# Patient Record
Sex: Female | Born: 1960 | Race: White | Hispanic: No | Marital: Married | Smoking: Current every day smoker
Health system: Southern US, Community
[De-identification: ages and names within clinical notes are randomized; demographics above are authoritative.]

## PROBLEM LIST (undated history)

## (undated) DIAGNOSIS — J45909 Unspecified asthma, uncomplicated: Secondary | ICD-10-CM

## (undated) DIAGNOSIS — R42 Dizziness and giddiness: Secondary | ICD-10-CM

---

## 1999-06-03 ENCOUNTER — Emergency Department (HOSPITAL_COMMUNITY): Admission: EM | Admit: 1999-06-03 | Discharge: 1999-06-03 | Payer: Self-pay | Admitting: Emergency Medicine

## 2010-04-16 ENCOUNTER — Emergency Department (HOSPITAL_BASED_OUTPATIENT_CLINIC_OR_DEPARTMENT_OTHER): Admission: EM | Admit: 2010-04-16 | Discharge: 2010-04-16 | Payer: Self-pay | Admitting: Emergency Medicine

## 2010-04-26 ENCOUNTER — Emergency Department (HOSPITAL_BASED_OUTPATIENT_CLINIC_OR_DEPARTMENT_OTHER): Admission: EM | Admit: 2010-04-26 | Discharge: 2010-04-26 | Payer: Self-pay | Admitting: Emergency Medicine

## 2010-05-06 ENCOUNTER — Emergency Department (HOSPITAL_BASED_OUTPATIENT_CLINIC_OR_DEPARTMENT_OTHER): Admission: EM | Admit: 2010-05-06 | Discharge: 2010-05-06 | Payer: Self-pay | Admitting: Emergency Medicine

## 2010-05-30 ENCOUNTER — Emergency Department (HOSPITAL_COMMUNITY): Admission: EM | Admit: 2010-05-30 | Discharge: 2010-05-30 | Payer: Self-pay | Admitting: Family Medicine

## 2011-03-04 LAB — GLUCOSE, CAPILLARY: Glucose-Capillary: 92 mg/dL (ref 70–99)

## 2017-02-03 ENCOUNTER — Emergency Department (HOSPITAL_COMMUNITY): Payer: Self-pay

## 2017-02-03 ENCOUNTER — Emergency Department (HOSPITAL_COMMUNITY)
Admission: EM | Admit: 2017-02-03 | Discharge: 2017-02-03 | Disposition: A | Discharge status: Home / Self Care | Payer: Self-pay | Attending: Emergency Medicine | Admitting: Emergency Medicine

## 2017-02-03 ENCOUNTER — Encounter (HOSPITAL_COMMUNITY): Payer: Self-pay | Admitting: Emergency Medicine

## 2017-02-03 DIAGNOSIS — J45909 Unspecified asthma, uncomplicated: Secondary | ICD-10-CM | POA: Insufficient documentation

## 2017-02-03 DIAGNOSIS — F17210 Nicotine dependence, cigarettes, uncomplicated: Secondary | ICD-10-CM | POA: Insufficient documentation

## 2017-02-03 DIAGNOSIS — J209 Acute bronchitis, unspecified: Secondary | ICD-10-CM | POA: Insufficient documentation

## 2017-02-03 HISTORY — DX: Dizziness and giddiness: R42

## 2017-02-03 HISTORY — DX: Unspecified asthma, uncomplicated: J45.909

## 2017-02-03 MED ORDER — PREDNISONE 20 MG PO TABS
40.0000 mg | ORAL_TABLET | Freq: Once | ORAL | Status: AC
  Administered 2017-02-03: 40 mg via ORAL
  Filled 2017-02-03: qty 2

## 2017-02-03 MED ORDER — IBUPROFEN 800 MG PO TABS
800.0000 mg | ORAL_TABLET | Freq: Once | ORAL | Status: AC
  Administered 2017-02-03: 800 mg via ORAL
  Filled 2017-02-03: qty 1

## 2017-02-03 MED ORDER — HYDROCOD POLST-CPM POLST ER 10-8 MG/5ML PO SUER
5.0000 mL | Freq: Once | ORAL | Status: AC
  Administered 2017-02-03: 5 mL via ORAL
  Filled 2017-02-03: qty 5

## 2017-02-03 MED ORDER — AZITHROMYCIN 250 MG PO TABS
500.0000 mg | ORAL_TABLET | Freq: Once | ORAL | Status: AC
  Administered 2017-02-03: 500 mg via ORAL
  Filled 2017-02-03: qty 2

## 2017-02-03 MED ORDER — AZITHROMYCIN 250 MG PO TABS: ORAL_TABLET | ORAL | 0 refills | Status: AC

## 2017-02-03 MED ORDER — DEXAMETHASONE 4 MG PO TABS: 4.0000 mg | ORAL_TABLET | Freq: Two times a day (BID) | ORAL | 0 refills | Status: AC

## 2017-02-03 MED ORDER — PROMETHAZINE-DM 6.25-15 MG/5ML PO SYRP: 5.0000 mL | ORAL_SOLUTION | Freq: Four times a day (QID) | ORAL | 0 refills | Status: AC | PRN

## 2017-02-03 MED ORDER — ALBUTEROL SULFATE HFA 108 (90 BASE) MCG/ACT IN AERS
2.0000 | INHALATION_SPRAY | Freq: Once | RESPIRATORY_TRACT | Status: AC
  Administered 2017-02-03: 2 via RESPIRATORY_TRACT
  Filled 2017-02-03: qty 6.7

## 2017-02-03 NOTE — ED Triage Notes (Signed)
Patient complains of cough x 1 month since she had the flu. Patient states fever at home. Afebrile in triage. NAD.

## 2017-02-03 NOTE — Discharge Instructions (Signed)
Your oxygen level is 100% on room air. Your chest x-ray is negative for acute lung problem or acute rib area problem. Your examination suggest bronchitis and upper respiratory infection. Please use Zithromax daily. Use 2 puffs of albuterol every 4 hours. Decadron 2 times daily with food, and use promethazine DM cough medication of 4 times daily as needed for cough. Please stop smoking.

## 2017-02-03 NOTE — ED Provider Notes (Signed)
[facility] Provider Note   CSN: [id number] Arrival date & time: 02/03/17  1811     History   Chief Complaint Chief Complaint  Patient presents with  . Cough    HPI Jasmin Zamora is a 56 y.o. female.  The history is provided by the patient.  Cough  This is a new problem. Episode onset: 1 month. The problem occurs every few hours. The problem has been gradually worsening. The cough is productive of sputum. There has been no fever. Associated symptoms include chest pain, chills, shortness of breath and wheezing. She has tried nothing for the symptoms. She is a smoker. Her past medical history is significant for bronchitis. Her past medical history does not include pneumonia, emphysema or asthma.    Past Medical History:  Diagnosis Date  . Asthma   . Vertigo     There are no active problems to display for this patient.   History reviewed. No pertinent surgical history.  OB History    No data available       Home Medications    Prior to Admission medications   Not on File    Family History No family history on file.  Social History Social History  Substance Use Topics  . Smoking status: Current Every Day Smoker    Packs/day: 0.50    Types: Cigarettes  . Smokeless tobacco: Never Used  . Alcohol use No     Allergies   Keflex [cephalexin]   Review of Systems Review of Systems  Constitutional: Positive for chills.  Respiratory: Positive for cough, shortness of breath and wheezing.   Cardiovascular: Positive for chest pain.  All other systems reviewed and are negative.    Physical Exam Updated Vital Signs BP 125/76 (BP Location: Right Arm)   Pulse 79   Temp 98 F (36.7 C) (Oral)   Resp 16   Ht 5\' 4"  (1.626 m)   Wt 54.9 kg   SpO2 100%   BMI 20.77 kg/m   Physical Exam  Constitutional: Vital signs are normal. She appears well-developed and well-nourished. She is active.  HENT:  Head: Normocephalic and atraumatic.  Right Ear:  Tympanic membrane, external ear and ear canal normal.  Left Ear: Tympanic membrane, external ear and ear canal normal.  Nose: Nose normal.  Mouth/Throat: Uvula is midline, oropharynx is clear and moist and mucous membranes are normal.  Nasal congestion present.  Eyes: Conjunctivae, EOM and lids are normal. Pupils are equal, round, and reactive to light.  Neck: Trachea normal, normal range of motion and phonation normal. Neck supple. Carotid bruit is not present.  Cardiovascular: Normal rate, regular rhythm and normal pulses.   Pulmonary/Chest:  End expiratory wheeze. Scattered rhonchi.  Abdominal: Soft. Normal appearance and bowel sounds are normal.  Lymphadenopathy:       Head (right side): No submental, no preauricular and no posterior auricular adenopathy present.       Head (left side): No submental, no preauricular and no posterior auricular adenopathy present.    She has no cervical adenopathy.  Neurological: She is alert. She has normal strength. No cranial nerve deficit or sensory deficit. GCS eye subscore is 4. GCS verbal subscore is 5. GCS motor subscore is 6.  Skin: Skin is warm and dry.  Psychiatric: Her speech is normal.     ED Treatments / Results  Labs (all labs ordered are listed, but only abnormal results are displayed) Labs Reviewed - No data to display  EKG  EKG Interpretation None  Radiology Dg Chest 2 View  Result Date: 02/03/2017 CLINICAL DATA:  56 y/o  F; 1 month of cough. EXAM: CHEST  2 VIEW COMPARISON:  None. FINDINGS: The heart size and mediastinal contours are within normal limits. Both lungs are clear. The visualized skeletal structures are unremarkable. IMPRESSION: No active cardiopulmonary disease. Electronically Signed   By: Mitzi HansenLance  Furusawa-Stratton M.D.   On: 02/03/2017 19:35    Procedures Procedures (including critical care time)  Medications Ordered in ED Medications - No data to display   Initial Impression / Assessment and Plan /  ED Course  I have reviewed the triage vital signs and the nursing notes.  Pertinent labs & imaging results that were available during my care of the patient were reviewed by me and considered in my medical decision making (see chart for details).     *I have reviewed nursing notes, vital signs, and all appropriate lab and imaging results for this patient.**  Final Clinical Impressions(s) / ED Diagnoses MDM Patient had influenza nearly a month ago. She states she's been having problems with cough since that time. 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 She is also a smoker. The chest x-ray is negative for acute problem. The examination suggest bronchitis. The plan at this time is for the patient to use albuterol 2 puffs every 4 hours, promethazine cough medication 4 times daily, Decadron 2 times daily, and Zithromax 1 daily. I've asked the patient to increase fluids. She will use Tylenol every 4 hours or ibuprofen every 6 hours for fever or aching. We discussed good handwashing, and we discussed good hydration. Patient is in agreement with this plan.    Final diagnoses:  Acute bronchitis, unspecified organism    New Prescriptions New Prescriptions   No medications on file     Ivery QualeHobson [name], Cordelia Poche-C 02/03/17 2037    Mancel BaleElliott Wentz, MD 02/04/17 1245

## 2017-02-03 NOTE — ED Notes (Signed)
Patient transported to X-ray 

## 2017-07-30 ENCOUNTER — Emergency Department (HOSPITAL_COMMUNITY)
Admission: EM | Admit: 2017-07-30 | Discharge: 2017-07-30 | Disposition: A | Discharge status: Home / Self Care | Payer: Self-pay | Attending: Emergency Medicine | Admitting: Emergency Medicine

## 2017-07-30 ENCOUNTER — Encounter (HOSPITAL_COMMUNITY): Payer: Self-pay | Admitting: Emergency Medicine

## 2017-07-30 ENCOUNTER — Emergency Department (HOSPITAL_COMMUNITY): Payer: Self-pay

## 2017-07-30 DIAGNOSIS — M79604 Pain in right leg: Secondary | ICD-10-CM | POA: Insufficient documentation

## 2017-07-30 DIAGNOSIS — J45909 Unspecified asthma, uncomplicated: Secondary | ICD-10-CM | POA: Insufficient documentation

## 2017-07-30 DIAGNOSIS — F17210 Nicotine dependence, cigarettes, uncomplicated: Secondary | ICD-10-CM | POA: Insufficient documentation

## 2017-07-30 DIAGNOSIS — G8929 Other chronic pain: Secondary | ICD-10-CM | POA: Insufficient documentation

## 2017-07-30 DIAGNOSIS — Z79899 Other long term (current) drug therapy: Secondary | ICD-10-CM | POA: Insufficient documentation

## 2017-07-30 MED ORDER — NAPROXEN 250 MG PO TABS
250.0000 mg | ORAL_TABLET | Freq: Two times a day (BID) | ORAL | 0 refills | Status: AC | PRN
Start: 2017-07-30 — End: ?

## 2017-07-30 NOTE — ED Triage Notes (Signed)
Pt has multiple complaints. Pt c/o vertigo and "lump" in leg x 2 years. Pt states pain in leg and increased lumps and swelling to right lower leg. Pt also reports flu like sx-ha/congestion.

## 2017-07-30 NOTE — Discharge Instructions (Signed)
Take the prescription as directed.  Apply moist heat or ice to the area(s) of discomfort, for 15 minutes at a time, several times per day for the next few days.  Do not fall asleep on a heating or ice pack.  Call your regular medical doctor tomorrow to schedule a follow up appointment within the next week.  Return to the Emergency Department immediately if worsening.

## 2017-07-30 NOTE — ED Provider Notes (Signed)
[facility] Provider Note   CSN: [id number] Arrival date & time: 07/30/17  1533     History   Chief Complaint Chief Complaint  Patient presents with  . Leg Pain    HPI [name] is a 56 y.o. female.  HPI  Pt was seen at 1825. Per pt, c/o gradual onset and persistence of constant RLE "pain" for the past 2 years, worse over the past several months. Pt states the pain has increased since she has had to "walk 5 miles to work." Pt states there have been "lumps" there for the past 2 years. Pt was evaluated by [facility] RN and was told she needed to come to the ED for evaluation. Denies rash, no fevers, no focal motor weakness, no tingling/numbness in extremities, no back pain, no abd pain.   Past Medical History:  Diagnosis Date  . Asthma   . Vertigo     There are no active problems to display for this patient.   History reviewed. No pertinent surgical history.  OB History    No data available       Home Medications    Prior to Admission medications   Medication Sig Start Date End Date Taking? Authorizing Provider  azithromycin (ZITHROMAX) 250 MG tablet 1 po daily. 02/03/17   Ivery QualeBryant, Hobson, PA-C  dexamethasone (DECADRON) 4 MG tablet Take 1 tablet (4 mg total) by mouth 2 (two) times daily with a meal. 02/03/17   Ivery QualeBryant, Hobson, PA-C  naproxen (NAPROSYN) 250 MG tablet Take 1 tablet (250 mg total) by mouth 2 (two) times daily as needed for mild pain or moderate pain (take with food). 07/30/17   Samuel JesterMcManus, Townsend Cudworth, DO  promethazine-dextromethorphan (PROMETHAZINE-DM) 6.25-15 MG/5ML syrup Take 5 mLs by mouth 4 (four) times daily as needed for cough. 02/03/17   Ivery QualeBryant, Hobson, PA-C    Family History No family history on file.  Social History Social History  Substance Use Topics  . Smoking status: Current Every Day Smoker    Packs/day: 0.50    Types: Cigarettes  . Smokeless tobacco: Never Used  . Alcohol use No     Allergies   Keflex  [cephalexin]   Review of Systems Review of Systems ROS: Statement: All systems negative except as marked or noted in the HPI; Constitutional: Negative for fever and chills. ; ; Eyes: Negative for eye pain, redness and discharge. ; ; ENMT: Negative for ear pain, hoarseness, nasal congestion, sinus pressure and sore throat. ; ; Cardiovascular: Negative for chest pain, palpitations, diaphoresis, dyspnea and peripheral edema. ; ; Respiratory: Negative for cough, wheezing and stridor. ; ; Gastrointestinal: Negative for nausea, vomiting, diarrhea, abdominal pain, blood in stool, hematemesis, jaundice and rectal bleeding. . ; ; Genitourinary: Negative for dysuria, flank pain and hematuria. ; ; Musculoskeletal: +RLE pain. Negative for back pain and neck pain. Negative for swelling and trauma.; ; Skin: Negative for pruritus, rash, abrasions, blisters, bruising and skin lesion.; ; Neuro: Negative for headache, lightheadedness and neck stiffness. Negative for weakness, altered level of consciousness, altered mental status, extremity weakness, paresthesias, involuntary movement, seizure and syncope.       Physical Exam Updated Vital Signs BP 125/81 (BP Location: Right Arm)   Pulse 94   Temp 98.1 F (36.7 C) (Oral)   Resp 18   Wt 55.8 kg (123 lb)   SpO2 98%   BMI 21.11 kg/m   Physical Exam 1830: Physical examination:  Nursing notes reviewed; Vital signs and O2 SAT reviewed;  Constitutional:  Well developed, Well nourished, Well hydrated, In no acute distress; Head:  Normocephalic, atraumatic; Eyes: EOMI, PERRL, No scleral icterus; ENMT: Mouth and pharynx normal, Mucous membranes moist; Neck: Supple, Full range of motion, No lymphadenopathy; Cardiovascular: Regular rate and rhythm, No gallop; Respiratory: Breath sounds clear & equal bilaterally, No wheezes.  Speaking full sentences with ease, Normal respiratory effort/excursion; Chest: Nontender, Movement normal; Abdomen: Soft, Nontender, Nondistended,  Normal bowel sounds; Genitourinary: No CVA tenderness; Extremities: Pulses normal, NT right hip/knee/ankle/foot. +right lateral lower leg and calf tenderness to palp, no deformity, no rash, no edema. +several small NT nodules palpated right lateral muscles, no fluctuance. No calf edema or asymmetry.; Neuro: AA&Ox3, Major CN grossly intact.  Speech clear. No gross focal motor or sensory deficits in extremities.; Skin: Color normal, Warm, Dry.   ED Treatments / Results  Labs (all labs ordered are listed, but only abnormal results are displayed)   EKG  EKG Interpretation None       Radiology   Procedures Procedures (including critical care time)  Medications Ordered in ED Medications - No data to display   Initial Impression / Assessment and Plan / ED Course  I have reviewed the triage vital signs and the nursing notes.  Pertinent labs & imaging results that were available during my care of the patient were reviewed by me and considered in my medical decision making (see chart for details).  MDM Reviewed: nursing note, vitals and previous chart Interpretation: ultrasound    US Venous Img Lower Unilateral Right Result Date: 07/30/2017 CLINICAL DATA:  Right lower extremity pain and swelling. EXAM: RIGHT LOWER EXTREMITY VENOUS DOPPLER ULTRASOUND TECHNIQUE: Gray-scale sonography with graded compression, as well as color Doppler and duplex ultrasound were performed to evaluate the lower extremity deep venous systems from the level of the common femoral vein and including the common femoral, femoral, profunda femoral, popliteal and calf veins including the posterior tibial, peroneal and gastrocnemius veins when visible. The superficial great saphenous vein was also interrogated. Spectral Doppler was utilized to evaluate flow at rest and with distal augmentation maneuvers in the common femoral, femoral and popliteal veins. COMPARISON:  None. FINDINGS: Contralateral Common Femoral Vein:  Respiratory phasicity is normal and symmetric with the symptomatic side. No evidence of thrombus. Normal compressibility. Common Femoral Vein: No evidence of thrombus. Normal compressibility, respiratory phasicity and response to augmentation. Saphenofemoral Junction: No evidence of thrombus. Normal compressibility and flow on color Doppler imaging. Profunda Femoral Vein: No evidence of thrombus. Normal compressibility and flow on color Doppler imaging. Femoral Vein: No evidence of thrombus. Normal compressibility, respiratory phasicity and response to augmentation. Popliteal Vein: No evidence of thrombus. Normal compressibility, respiratory phasicity and response to augmentation. Calf Veins: No evidence of thrombus. Normal compressibility and flow on color Doppler imaging of the posterior tibial vein, peroneal vein not well visualized. Superficial Great Saphenous Vein: No evidence of thrombus. Normal compressibility and flow on color Doppler imaging. Venous Reflux:  None. Other Findings:  None. IMPRESSION: No evidence of DVT within the right lower extremity. Electronically Signed   By: Rubye Oaks M.D.   On: 07/30/2017 16:58    1830:  [place] reassuring. Tx symptomatically, f/u PMD (outpt resources given). Dx and testing d/w pt and family.  Questions answered.  Verb understanding, agreeable to d/c home with outpt f/u.   Final Clinical Impressions(s) / ED Diagnoses     New Prescriptions    Samuel Jester, DO 08/02/17 1335

## 2017-10-26 IMAGING — US US EXTREM LOW VENOUS*R*
1 series · 13 of 24 positions shown · non-contrast
Comparison: None.

CLINICAL DATA: Right lower extremity pain and swelling.



[Series 1: us extrem low venous*right* · 0.06mm/px · 13 of 47 slices shown]
[im 1/47]
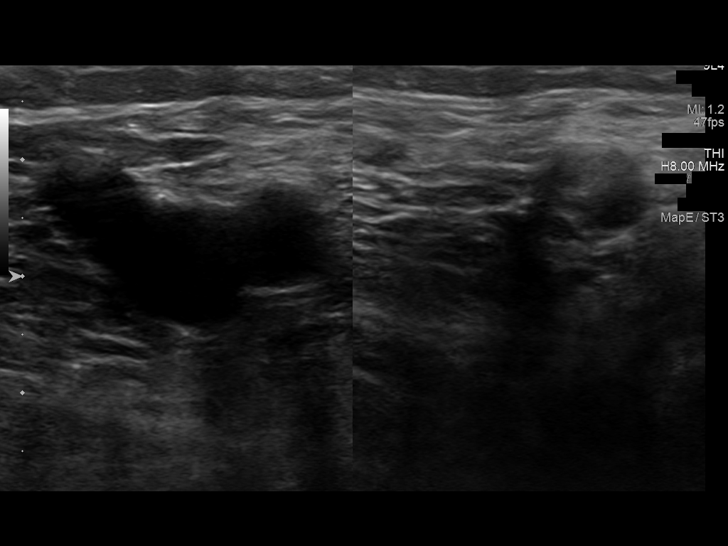
[im 5/47]
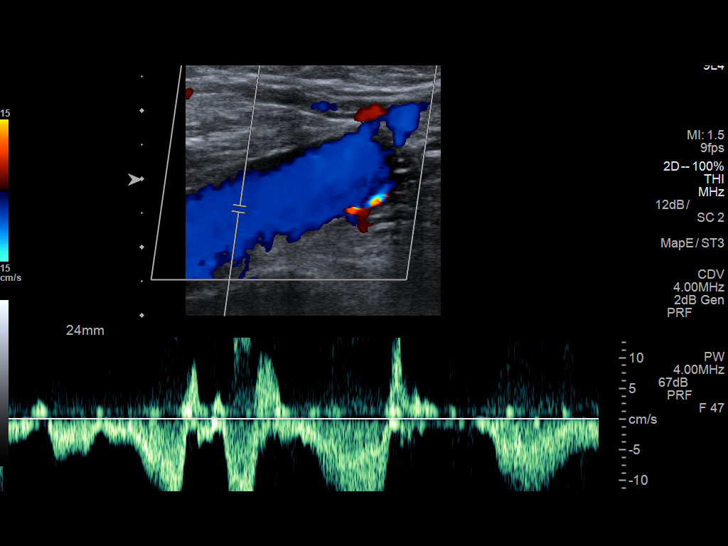
[im 9/47]
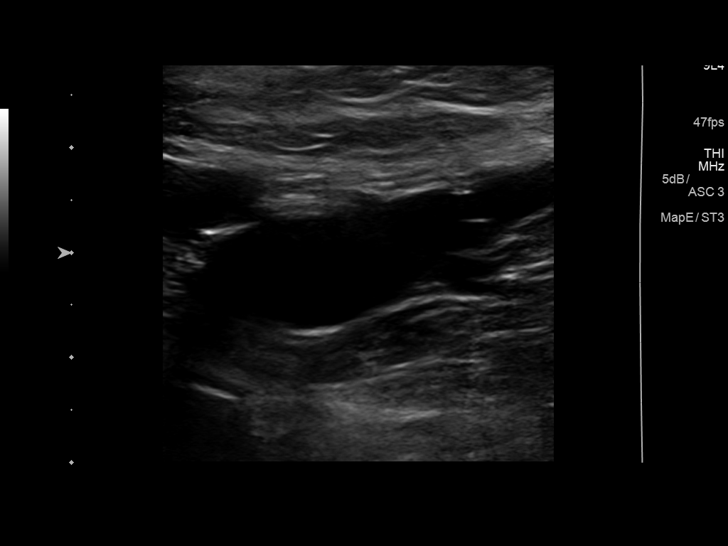
[im 13/47]
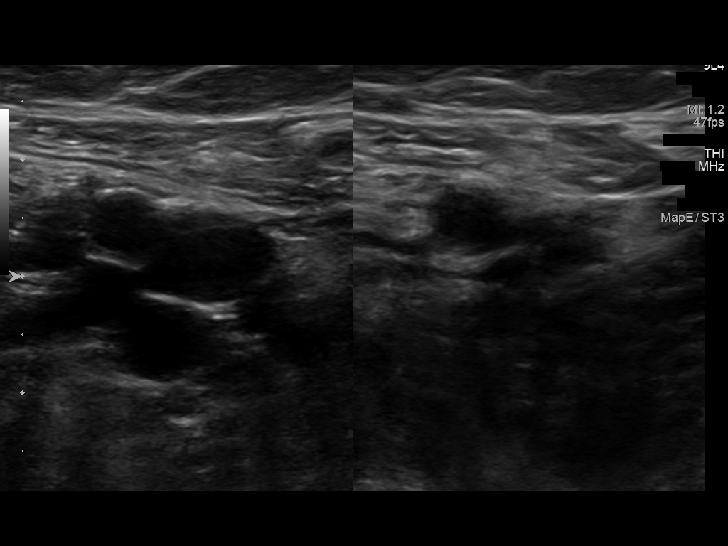
[im 17/47]
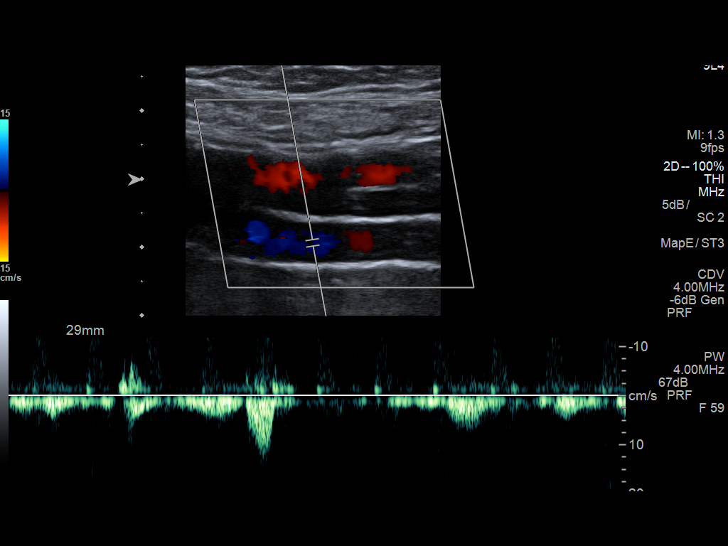
[im 21/47]
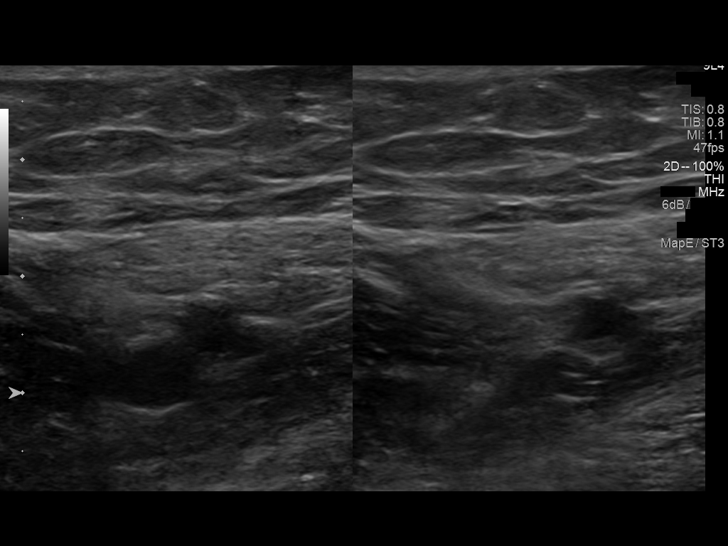
[im 25/47]
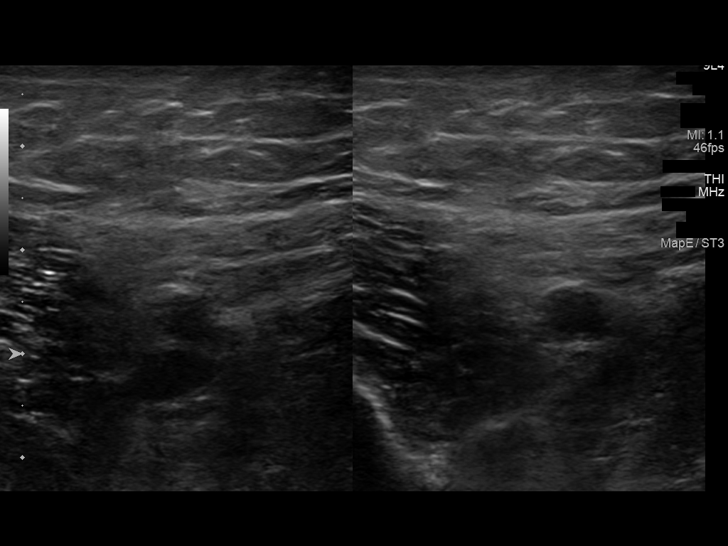
[im 27/47]
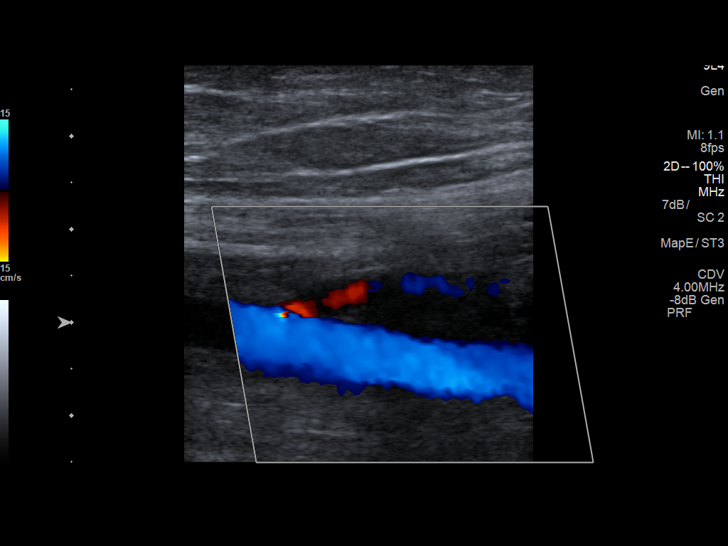
[im 31/47]
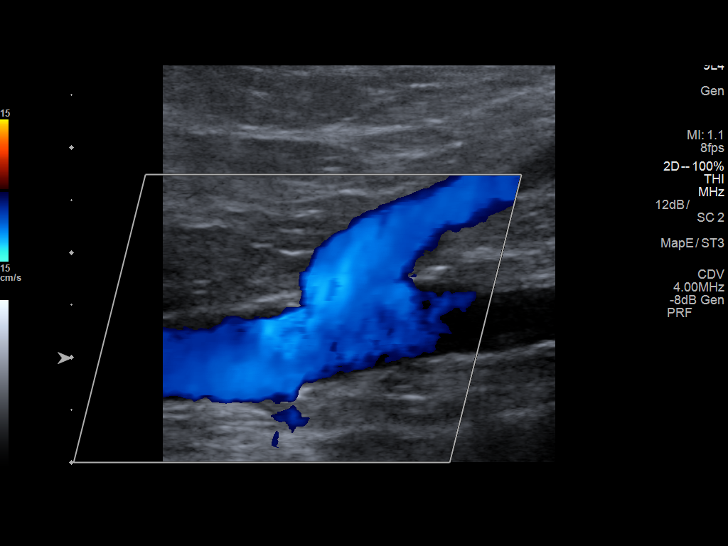
[im 35/47]
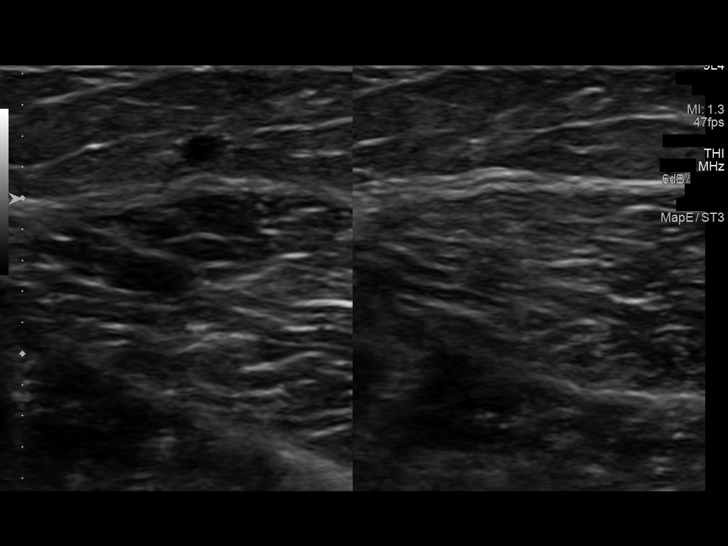
[im 39/47]
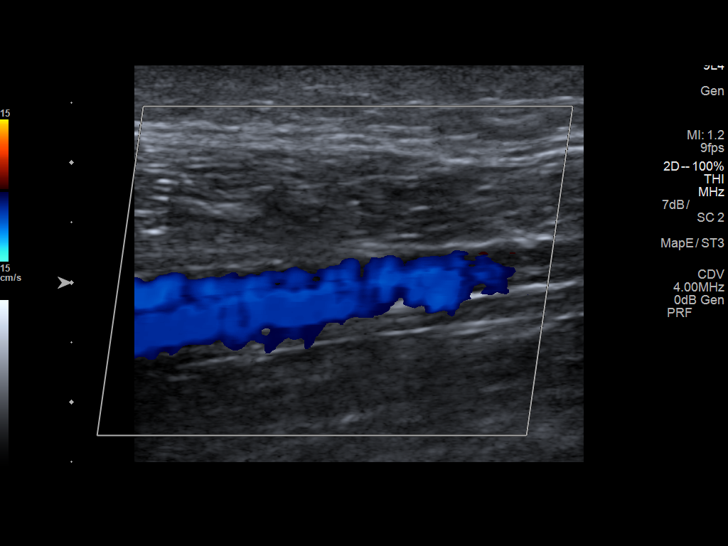
[im 43/47]
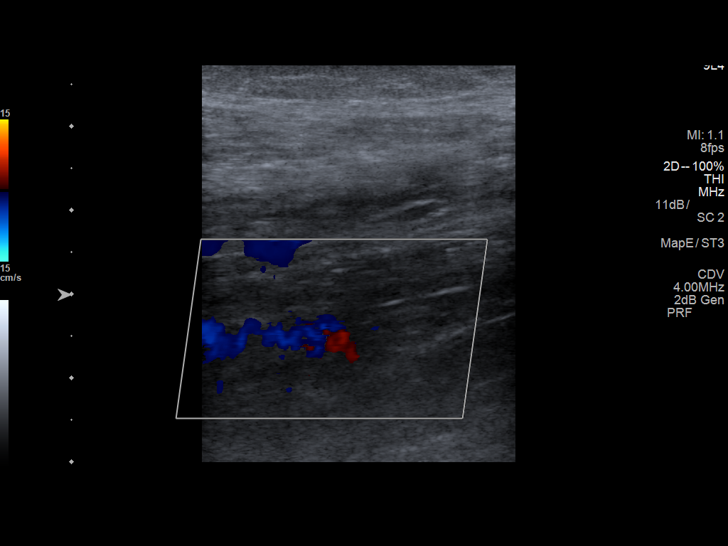
[im 47/47]
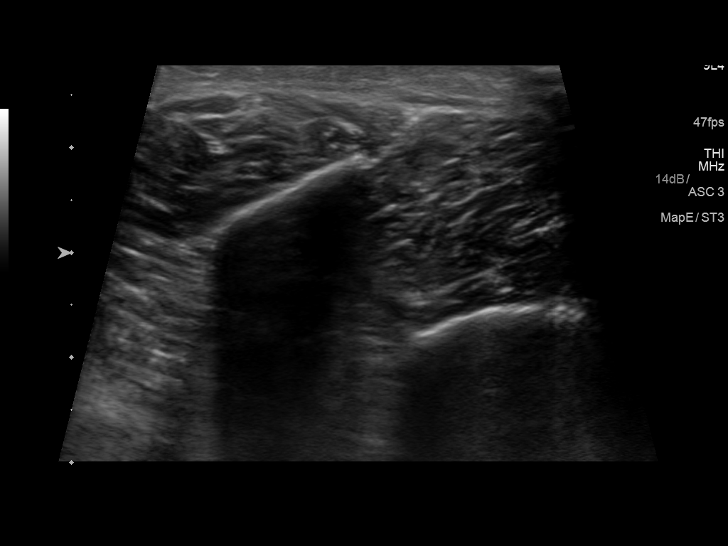

[13 of 24 positions shown; findings below may reference images not displayed]

FINDINGS: Contralateral Common Femoral Vein: Respiratory phasicity is normal
and symmetric with the symptomatic side. No evidence of thrombus.
Normal compressibility.

Common Femoral Vein: No evidence of thrombus. Normal
compressibility, respiratory phasicity and response to augmentation.

Saphenofemoral Junction: No evidence of thrombus. Normal
compressibility and flow on color Doppler imaging.

Profunda Femoral Vein: No evidence of thrombus. Normal
compressibility and flow on color Doppler imaging.

Femoral Vein: No evidence of thrombus. Normal compressibility,
respiratory phasicity and response to augmentation.

Popliteal Vein: No evidence of thrombus. Normal compressibility,
respiratory phasicity and response to augmentation.

Calf Veins: No evidence of thrombus. Normal compressibility and flow
on color Doppler imaging of the posterior tibial vein, peroneal vein
not well visualized.

Superficial Great Saphenous Vein: No evidence of thrombus. Normal
compressibility and flow on color Doppler imaging.

Venous Reflux:  None.

Other Findings:  None.
IMPRESSION: No evidence of DVT within the right lower extremity.

## 2017-12-01 IMAGING — DX DG CHEST 2V
2 series · 2 of 2 positions shown · non-contrast
Comparison: None.

CLINICAL DATA: 55 y/o  F; 1 month of cough.

EXAM:
CHEST  2 VIEW

[chest pa]
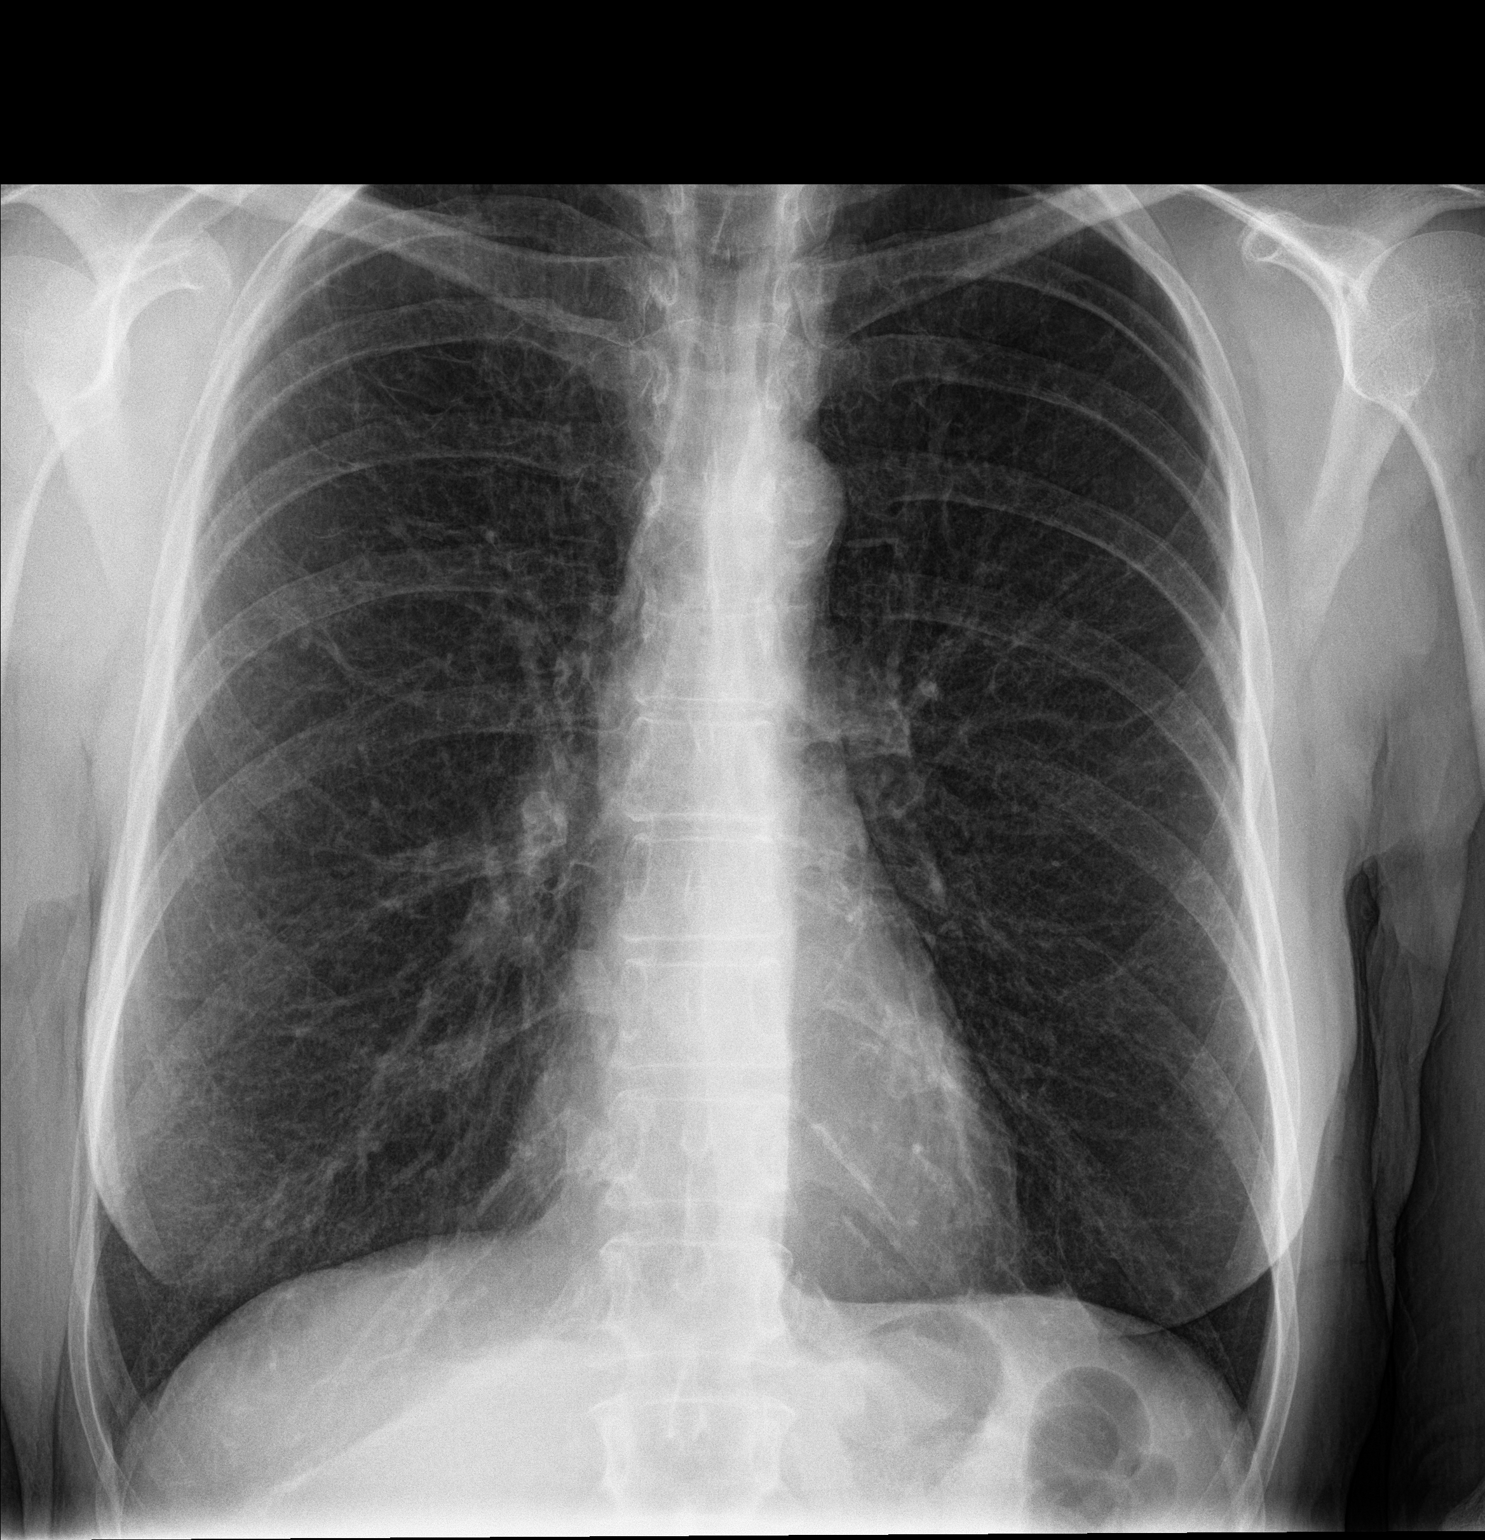

[chest lat]
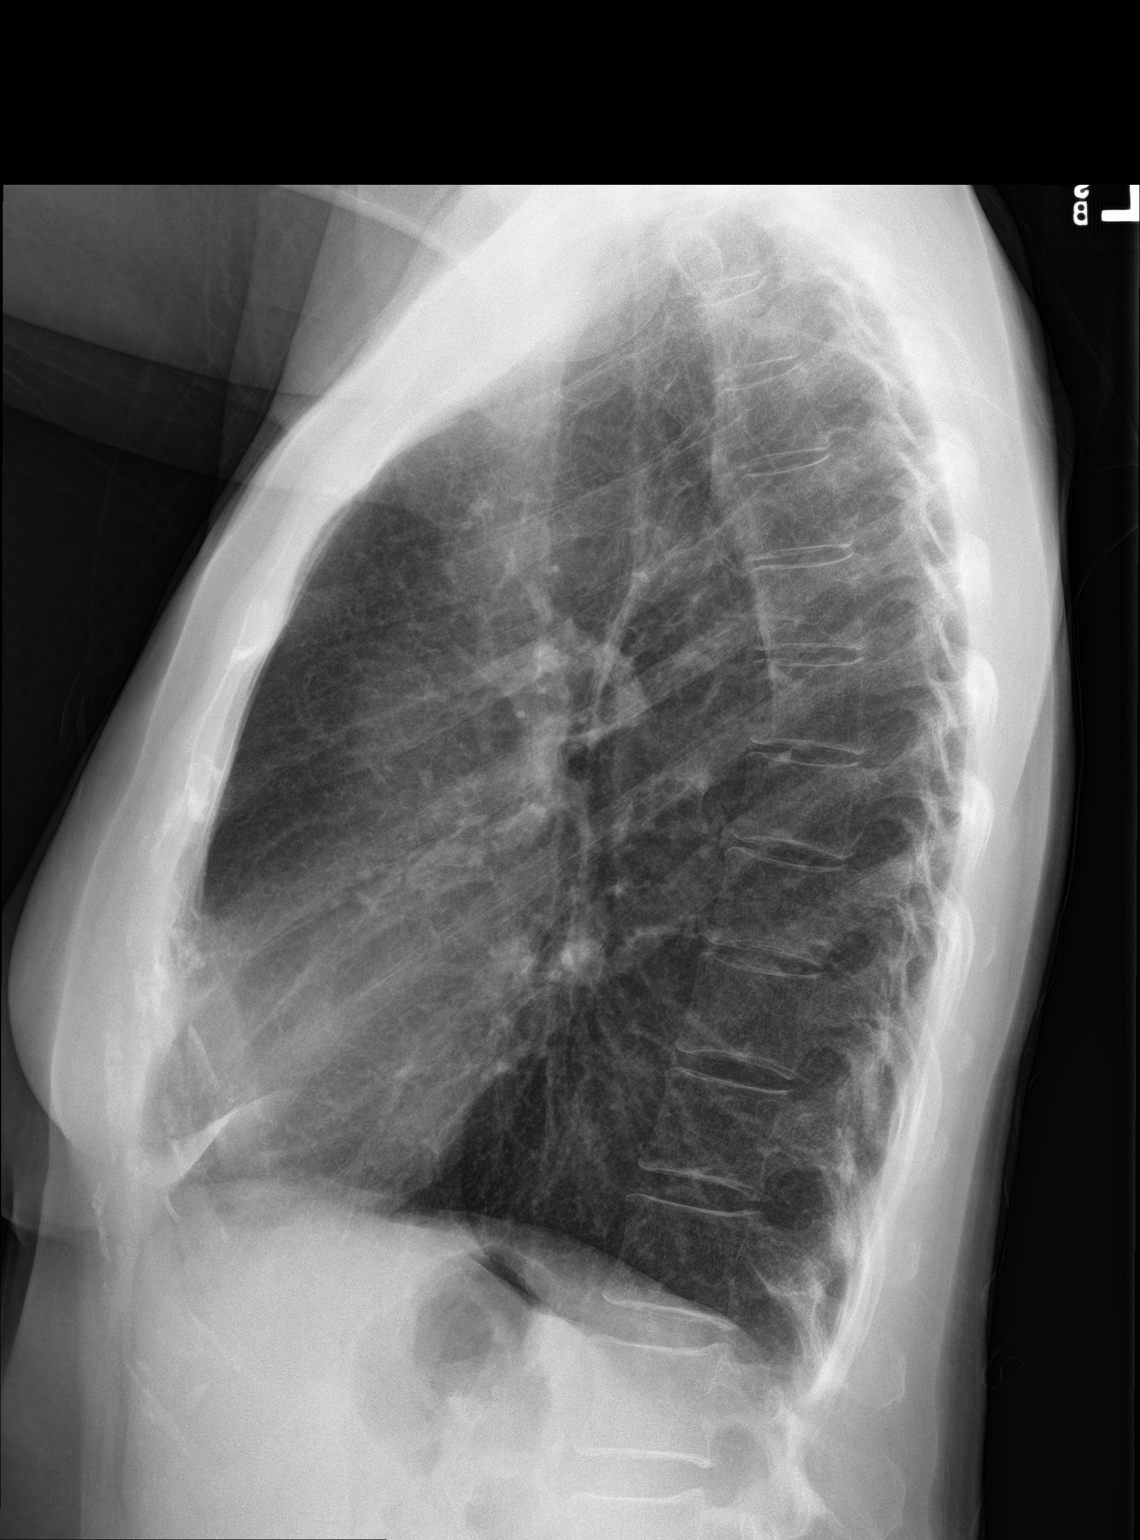

[2 of 2 positions shown; findings below may reference images not displayed]

FINDINGS: The heart size and mediastinal contours are within normal limits.
Both lungs are clear. The visualized skeletal structures are
unremarkable.
IMPRESSION: No active cardiopulmonary disease.

By: Adamovna Lafachi M.D.
